# Patient Record
Sex: Female | Born: 1980 | Race: White | Hispanic: No | Marital: Married | Smoking: Never smoker
Health system: Southern US, Community
[De-identification: ages and names within clinical notes are randomized; demographics above are authoritative.]

## PROBLEM LIST (undated history)

## (undated) DIAGNOSIS — N159 Renal tubulo-interstitial disease, unspecified: Secondary | ICD-10-CM

## (undated) HISTORY — PX: KNEE SURGERY: SHX244

## (undated) HISTORY — DX: Renal tubulo-interstitial disease, unspecified: N15.9

---

## 2005-02-13 ENCOUNTER — Other Ambulatory Visit: Admission: RE | Admit: 2005-02-13 | Discharge: 2005-02-13 | Payer: Self-pay | Admitting: Family Medicine

## 2005-03-27 ENCOUNTER — Encounter: Admission: RE | Admit: 2005-03-27 | Discharge: 2005-03-27 | Payer: Self-pay | Admitting: Family Medicine

## 2005-12-10 ENCOUNTER — Ambulatory Visit: Payer: Self-pay | Admitting: Family Medicine

## 2006-08-01 ENCOUNTER — Inpatient Hospital Stay (HOSPITAL_COMMUNITY): Admission: AD | Admit: 2006-08-01 | Discharge: 2006-08-04 | Payer: Self-pay | Admitting: Obstetrics and Gynecology

## 2007-04-01 ENCOUNTER — Ambulatory Visit: Payer: Self-pay | Admitting: Family Medicine

## 2007-07-10 ENCOUNTER — Ambulatory Visit: Payer: Self-pay | Admitting: Family Medicine

## 2008-07-20 ENCOUNTER — Ambulatory Visit: Payer: Self-pay | Admitting: Family Medicine

## 2009-06-29 ENCOUNTER — Inpatient Hospital Stay (HOSPITAL_COMMUNITY): Admission: AD | Admit: 2009-06-29 | Discharge: 2009-06-29 | Payer: Self-pay | Admitting: Obstetrics & Gynecology

## 2009-11-15 ENCOUNTER — Inpatient Hospital Stay (HOSPITAL_COMMUNITY): Admission: RE | Admit: 2009-11-15 | Discharge: 2009-11-17 | Payer: Self-pay | Admitting: Obstetrics and Gynecology

## 2010-01-09 ENCOUNTER — Ambulatory Visit: Payer: Self-pay | Admitting: Physician Assistant

## 2010-01-12 ENCOUNTER — Ambulatory Visit: Payer: Self-pay | Admitting: Physician Assistant

## 2010-07-02 NOTE — L&D Delivery Note (Signed)
Delivery Note At 4:33 PM a viable female was delivered via Vaginal, Spontaneous Delivery (Presentation: ; Occiput Anterior).  APGAR: 9, 9; weight .   Placenta status: , .  Cord: 3 vessels with the following complications: .  Cord pH: not done  Anesthesia: Epidural  Episiotomy: none Lacerations: second dgree Suture Repair: chromic Est. Blood Loss (mL):   Mom to postpartum.  Baby to nursery-stable.  [name] S 03/14/2011, 4:42 PM

## 2010-08-09 LAB — GC/CHLAMYDIA PROBE AMP, GENITAL: Gonorrhea: NEGATIVE

## 2010-08-09 LAB — RPR: RPR: NONREACTIVE

## 2010-08-09 LAB — ABO/RH: RH Type: POSITIVE

## 2010-09-18 LAB — CBC
HCT: 35.1 % — ABNORMAL LOW (ref 36.0–46.0)
MCHC: 34.5 g/dL (ref 30.0–36.0)
MCV: 92.7 fL (ref 78.0–100.0)
MCV: 92.8 fL (ref 78.0–100.0)
Platelets: 198 10*3/uL (ref 150–400)
Platelets: 246 10*3/uL (ref 150–400)
RBC: 3.28 MIL/uL — ABNORMAL LOW (ref 3.87–5.11)
RBC: 3.78 MIL/uL — ABNORMAL LOW (ref 3.87–5.11)
RDW: 14.7 % (ref 11.5–15.5)

## 2010-09-18 LAB — RPR: RPR Ser Ql: NONREACTIVE

## 2010-10-02 LAB — CBC
MCHC: 33.6 g/dL (ref 30.0–36.0)
Platelets: 286 10*3/uL (ref 150–400)
RDW: 13.5 % (ref 11.5–15.5)

## 2010-10-02 LAB — COMPREHENSIVE METABOLIC PANEL
Alkaline Phosphatase: 47 U/L (ref 39–117)
Calcium: 9.1 mg/dL (ref 8.4–10.5)
GFR calc Af Amer: >60 mL/min (ref >60)
Total Protein: 5.7 g/dL — ABNORMAL LOW (ref 6.0–8.3)

## 2010-10-02 LAB — URINALYSIS, ROUTINE W REFLEX MICROSCOPIC
Bilirubin Urine: NEGATIVE
Glucose, UA: 250 mg/dL — AB
Hgb urine dipstick: NEGATIVE
Ketones, ur: NEGATIVE mg/dL

## 2010-10-02 LAB — URINE MICROSCOPIC-ADD ON

## 2011-03-01 ENCOUNTER — Other Ambulatory Visit: Payer: Self-pay

## 2011-03-01 ENCOUNTER — Telehealth: Payer: Self-pay | Admitting: Family Medicine

## 2011-03-01 MED ORDER — ALBUTEROL SULFATE HFA 108 (90 BASE) MCG/ACT IN AERS: 2.0000 | INHALATION_SPRAY | Freq: Four times a day (QID) | RESPIRATORY_TRACT | Status: DC | PRN

## 2011-03-01 NOTE — Telephone Encounter (Signed)
Sent pt meds in  

## 2011-03-01 NOTE — Telephone Encounter (Signed)
Med sent in again  

## 2011-03-06 ENCOUNTER — Telehealth: Payer: Self-pay | Admitting: Medical

## 2011-03-06 ENCOUNTER — Other Ambulatory Visit: Payer: Self-pay | Admitting: Medical

## 2011-03-06 MED ORDER — ALBUTEROL SULFATE HFA 108 (90 BASE) MCG/ACT IN AERS: 2.0000 | INHALATION_SPRAY | Freq: Four times a day (QID) | RESPIRATORY_TRACT | Status: DC | PRN

## 2011-03-06 NOTE — Telephone Encounter (Signed)
i sent refill on inhaler.  I don't think we've seen her in since 7/11.  Lets see her back for recheck on albuterol/asthma, or physical if due.

## 2011-03-07 ENCOUNTER — Other Ambulatory Visit: Payer: Self-pay | Admitting: *Deleted

## 2011-03-09 ENCOUNTER — Encounter (HOSPITAL_COMMUNITY): Payer: Self-pay | Admitting: *Deleted

## 2011-03-09 ENCOUNTER — Telehealth (HOSPITAL_COMMUNITY): Payer: Self-pay | Admitting: *Deleted

## 2011-03-09 NOTE — Telephone Encounter (Signed)
Preadmission screen  

## 2011-03-14 ENCOUNTER — Inpatient Hospital Stay (HOSPITAL_COMMUNITY)
Admission: RE | Admit: 2011-03-14 | Discharge: 2011-03-16 | DRG: 775 | Disposition: A | Discharge status: Home / Self Care | Payer: 59 | Source: Ambulatory Visit | Attending: Obstetrics and Gynecology | Admitting: Obstetrics and Gynecology

## 2011-03-14 ENCOUNTER — Inpatient Hospital Stay (HOSPITAL_COMMUNITY): Payer: 59 | Admitting: Anesthesiology

## 2011-03-14 ENCOUNTER — Encounter (HOSPITAL_COMMUNITY): Payer: Self-pay | Admitting: Anesthesiology

## 2011-03-14 ENCOUNTER — Encounter (HOSPITAL_COMMUNITY): Payer: Self-pay

## 2011-03-14 LAB — ABO/RH: ABO/RH(D): O POS

## 2011-03-14 LAB — CBC
HCT: 35.2 % — ABNORMAL LOW (ref 36.0–46.0)
MCHC: 33.8 g/dL (ref 30.0–36.0)
MCV: 92.4 fL (ref 78.0–100.0)
Platelets: 200 10*3/uL (ref 150–400)
RDW: 14 % (ref 11.5–15.5)
WBC: 13 10*3/uL — ABNORMAL HIGH (ref 4.0–10.5)

## 2011-03-14 MED ORDER — PRENATAL PLUS 27-1 MG PO TABS
1.0000 | ORAL_TABLET | Freq: Every day | ORAL | Status: DC
  Administered 2011-03-15 – 2011-03-16 (×2): 1 via ORAL
  Filled 2011-03-14 (×2): qty 1

## 2011-03-14 MED ORDER — PHENYLEPHRINE 40 MCG/ML (10ML) SYRINGE FOR IV PUSH (FOR BLOOD PRESSURE SUPPORT)
80.0000 ug | PREFILLED_SYRINGE | INTRAVENOUS | Status: DC | PRN
  Administered 2011-03-14: 80 ug via INTRAVENOUS
  Administered 2011-03-14: 10 ug via INTRAVENOUS
  Filled 2011-03-14: qty 5

## 2011-03-14 MED ORDER — SIMETHICONE 80 MG PO CHEW: 80.0000 mg | CHEWABLE_TABLET | ORAL | Status: DC | PRN

## 2011-03-14 MED ORDER — IBUPROFEN 600 MG PO TABS
600.0000 mg | ORAL_TABLET | Freq: Four times a day (QID) | ORAL | Status: DC
  Administered 2011-03-15 – 2011-03-16 (×6): 600 mg via ORAL
  Filled 2011-03-14 (×6): qty 1

## 2011-03-14 MED ORDER — OXYTOCIN 20 UNITS IN LACTATED RINGERS INFUSION - SIMPLE: 125.0000 mL/h | Freq: Once | INTRAVENOUS | Status: DC

## 2011-03-14 MED ORDER — LANOLIN HYDROUS EX OINT: TOPICAL_OINTMENT | CUTANEOUS | Status: DC | PRN

## 2011-03-14 MED ORDER — CITRIC ACID-SODIUM CITRATE 334-500 MG/5ML PO SOLN: 30.0000 mL | ORAL | Status: DC | PRN

## 2011-03-14 MED ORDER — FENTANYL 2.5 MCG/ML BUPIVACAINE 1/10 % EPIDURAL INFUSION (WH - ANES)
14.0000 mL/h | INTRAMUSCULAR | Status: DC
  Administered 2011-03-14: 14 mL/h via EPIDURAL
  Filled 2011-03-14: qty 60

## 2011-03-14 MED ORDER — DIPHENHYDRAMINE HCL 25 MG PO CAPS: 25.0000 mg | ORAL_CAPSULE | Freq: Four times a day (QID) | ORAL | Status: DC | PRN

## 2011-03-14 MED ORDER — BENZOCAINE-MENTHOL 20-0.5 % EX AERO
1.0000 "application " | INHALATION_SPRAY | CUTANEOUS | Status: DC | PRN
  Administered 2011-03-15: 1 via TOPICAL

## 2011-03-14 MED ORDER — LACTATED RINGERS IV SOLN: INTRAVENOUS | Status: DC

## 2011-03-14 MED ORDER — SENNOSIDES-DOCUSATE SODIUM 8.6-50 MG PO TABS
2.0000 | ORAL_TABLET | Freq: Every day | ORAL | Status: DC
  Administered 2011-03-15: 2 via ORAL

## 2011-03-14 MED ORDER — OXYCODONE-ACETAMINOPHEN 5-325 MG PO TABS: 2.0000 | ORAL_TABLET | ORAL | Status: DC | PRN

## 2011-03-14 MED ORDER — TETANUS-DIPHTH-ACELL PERTUSSIS 5-2.5-18.5 LF-MCG/0.5 IM SUSP: 0.5000 mL | Freq: Once | INTRAMUSCULAR | Status: DC

## 2011-03-14 MED ORDER — ONDANSETRON HCL 4 MG/2ML IJ SOLN
4.0000 mg | Freq: Four times a day (QID) | INTRAMUSCULAR | Status: DC | PRN
  Administered 2011-03-14: 4 mg via INTRAVENOUS
  Filled 2011-03-14: qty 2

## 2011-03-14 MED ORDER — INFLUENZA VIRUS VACC SPLIT PF IM SUSP
0.5000 mL | Freq: Once | INTRAMUSCULAR | Status: AC
  Administered 2011-03-15: 0.5 mL via INTRAMUSCULAR
  Filled 2011-03-14: qty 0.5

## 2011-03-14 MED ORDER — IBUPROFEN 600 MG PO TABS
600.0000 mg | ORAL_TABLET | Freq: Four times a day (QID) | ORAL | Status: DC | PRN
  Administered 2011-03-14: 600 mg via ORAL
  Filled 2011-03-14 (×2): qty 1

## 2011-03-14 MED ORDER — BISACODYL 10 MG RE SUPP: 10.0000 mg | Freq: Every day | RECTAL | Status: DC | PRN

## 2011-03-14 MED ORDER — OXYTOCIN BOLUS FROM INFUSION
500.0000 mL | Freq: Once | INTRAVENOUS | Status: DC
  Filled 2011-03-14: qty 500

## 2011-03-14 MED ORDER — ONDANSETRON HCL 4 MG PO TABS: 4.0000 mg | ORAL_TABLET | ORAL | Status: DC | PRN

## 2011-03-14 MED ORDER — ZOLPIDEM TARTRATE 5 MG PO TABS: 5.0000 mg | ORAL_TABLET | Freq: Every evening | ORAL | Status: DC | PRN

## 2011-03-14 MED ORDER — WITCH HAZEL-GLYCERIN EX PADS: 1.0000 "application " | MEDICATED_PAD | CUTANEOUS | Status: DC | PRN

## 2011-03-14 MED ORDER — EPHEDRINE 5 MG/ML INJ
10.0000 mg | INTRAVENOUS | Status: DC | PRN
  Filled 2011-03-14 (×2): qty 4

## 2011-03-14 MED ORDER — FLEET ENEMA 7-19 GM/118ML RE ENEM: 1.0000 | ENEMA | RECTAL | Status: DC | PRN

## 2011-03-14 MED ORDER — DIPHENHYDRAMINE HCL 50 MG/ML IJ SOLN: 12.5000 mg | INTRAMUSCULAR | Status: DC | PRN

## 2011-03-14 MED ORDER — ALBUTEROL SULFATE HFA 108 (90 BASE) MCG/ACT IN AERS
2.0000 | INHALATION_SPRAY | Freq: Four times a day (QID) | RESPIRATORY_TRACT | Status: DC | PRN
  Filled 2011-03-14: qty 6.7

## 2011-03-14 MED ORDER — EPHEDRINE 5 MG/ML INJ
10.0000 mg | INTRAVENOUS | Status: DC | PRN
  Filled 2011-03-14: qty 4

## 2011-03-14 MED ORDER — ACETAMINOPHEN 325 MG PO TABS: 650.0000 mg | ORAL_TABLET | ORAL | Status: DC | PRN

## 2011-03-14 MED ORDER — TERBUTALINE SULFATE 1 MG/ML IJ SOLN: 0.2500 mg | Freq: Once | INTRAMUSCULAR | Status: DC | PRN

## 2011-03-14 MED ORDER — LACTATED RINGERS IV SOLN
500.0000 mL | INTRAVENOUS | Status: DC | PRN
  Administered 2011-03-14: 300 mL via INTRAVENOUS
  Administered 2011-03-14: 1000 mL via INTRAVENOUS

## 2011-03-14 MED ORDER — LIDOCAINE HCL (PF) 1 % IJ SOLN
30.0000 mL | INTRAMUSCULAR | Status: DC | PRN
  Filled 2011-03-14 (×2): qty 30

## 2011-03-14 MED ORDER — DIBUCAINE 1 % RE OINT: 1.0000 "application " | TOPICAL_OINTMENT | RECTAL | Status: DC | PRN

## 2011-03-14 MED ORDER — OXYTOCIN 20 UNITS IN LACTATED RINGERS INFUSION - SIMPLE
1.0000 m[IU]/min | INTRAVENOUS | Status: DC
  Administered 2011-03-14: 8 m[IU]/min via INTRAVENOUS
  Administered 2011-03-14: 6 m[IU]/min via INTRAVENOUS
  Administered 2011-03-14: 4 m[IU]/min via INTRAVENOUS
  Administered 2011-03-14: 2 m[IU]/min via INTRAVENOUS
  Filled 2011-03-14: qty 1000

## 2011-03-14 MED ORDER — LACTATED RINGERS IV SOLN: 500.0000 mL | Freq: Once | INTRAVENOUS | Status: DC

## 2011-03-14 MED ORDER — ONDANSETRON HCL 4 MG/2ML IJ SOLN: 4.0000 mg | INTRAMUSCULAR | Status: DC | PRN

## 2011-03-14 MED ORDER — PHENYLEPHRINE 40 MCG/ML (10ML) SYRINGE FOR IV PUSH (FOR BLOOD PRESSURE SUPPORT)
80.0000 ug | PREFILLED_SYRINGE | INTRAVENOUS | Status: DC | PRN
  Filled 2011-03-14 (×2): qty 5

## 2011-03-14 MED ORDER — LIDOCAINE HCL 1.5 % IJ SOLN
INTRAMUSCULAR | Status: DC | PRN
  Administered 2011-03-14: 5 mL via EPIDURAL
  Administered 2011-03-14: 2 mL via EPIDURAL
  Administered 2011-03-14: 5 mL via EPIDURAL

## 2011-03-14 NOTE — Progress Notes (Deleted)
  On pitocin. C/o swelling and tightness in legs  R>L. Exam of legs pulses 2+ equal  Blanching erythema of both legs  2-3+ edema.  Not cords or tenderness in calf' s.  FHR reactive no decel. Ctx's 2-4 min on 6 mu of pitocin. cx still closed to FT.  Scarring of cx may be preventing dilation.

## 2011-03-14 NOTE — Anesthesia Procedure Notes (Signed)
Epidural Patient location during procedure: OB Start time: 03/14/2011 12:20 PM Reason for block: procedure for pain  Staffing Performed by: anesthesiologist   Preanesthetic Checklist Completed: patient identified, site marked, surgical consent, pre-op evaluation, timeout performed, IV checked, risks and benefits discussed and monitors and equipment checked  Epidural Patient position: sitting Prep: site prepped and draped and DuraPrep Patient monitoring: continuous pulse ox and blood pressure Approach: midline Injection technique: LOR air  Needle:  Needle type: Tuohy  Needle gauge: 17 G Needle length: 9 cm Catheter type: closed end flexible Catheter size: 19 Gauge Test dose: negative  Assessment Events: blood not aspirated, injection not painful, no injection resistance, negative IV test and no paresthesia

## 2011-03-14 NOTE — H&P (Signed)
Christina Velez is a 30 y.o. female presenting for arom.  At term with favorable cx. Neg GBS Maternal Medical History:  Prenatal complications: no prenatal complications Prenatal Complications - Diabetes: none.    OB History    Grav Para Term Preterm Abortions TAB SAB Ect Mult Living   3 2 2       2      Past Medical History  Diagnosis Date  . Kidney infection     hospitalized at 30y.o.  Marland Kitchen Asthma    Past Surgical History  Procedure Date  . Knee surgery     L knee 30 years old   Family History: family history includes Diabetes in her mother and Hypertension in her mother. Social History:  reports that she has never smoked. She has never used smokeless tobacco. She reports that she does not drink alcohol or use illicit drugs.  Review of Systems  All other systems reviewed and are negative.      Blood pressure 112/69, pulse 77, temperature 97.3 F (36.3 C), temperature source Oral, resp. rate 18, height 5\' 6"  (1.676 m), weight 140 lb (63.504 kg), last menstrual period 06/11/2010. Maternal Exam:  Uterine Assessment: Contraction strength is mild.  Contraction frequency is rare.   Abdomen: Patient reports no abdominal tenderness. Fundal height is c/w dates.   Estimated fetal weight is 7 lbs.   Fetal presentation: vertex  Introitus: Amniotic fluid character: clear.  Pelvis: adequate for delivery.   Cervix: Cervix evaluated by digital exam.     Physical Exam  Prenatal labs: ABO, Rh: O/Positive/-- (02/08 0000) Antibody: Negative (02/08 0000) Rubella:   RPR: Nonreactive (02/08 0000)  HBsAg: Negative (02/08 0000)  HIV: Non-reactive (02/08 0000)  GBS: Negative (08/23 0000)   Assessment/Plan:IUP at 39 + weeks with favorable cx.  For AROM.  RISK OF PITOCIN DISCUSSED.   Marquice Uddin S 03/14/2011, 7:49 AM

## 2011-03-14 NOTE — Plan of Care (Signed)
Problem: Consults Goal: Birthing Suites Patient Information Press F2 to bring up selections list Outcome: Completed/Met Date Met:  03/14/11  Pt 37-[redacted] weeks EGA and Inpatient induction

## 2011-03-14 NOTE — Anesthesia Preprocedure Evaluation (Signed)
Anesthesia Evaluation  Name, MR# and DOB Patient awake  General Assessment Comment  Reviewed: Allergy & Precautions, H&P , NPO status , Patient's Chart, lab work & pertinent test results, reviewed documented beta blocker date and time   History of Anesthesia Complications (+) PONV  Airway Mallampati: I TM Distance: >3 FB Neck ROM: full    Dental  (+) Teeth Intact   Pulmonary  asthma (about once a week inhaler use)  clear to auscultation  breath sounds clear to auscultation none    Cardiovascular regular Normal    Neuro/Psych Negative Neurological ROS  Negative Psych ROS  GI/Hepatic/Renal negative GI ROS  negative Liver ROS  negative Renal ROS        Endo/Other  Negative Endocrine ROS (+)      Abdominal   Musculoskeletal   Hematology negative hematology ROS (+)   Peds  Reproductive/Obstetrics (+) Pregnancy    Anesthesia Other Findings             Anesthesia Physical Anesthesia Plan  ASA: II  Anesthesia Plan: Epidural   Post-op Pain Management:    Induction:   Airway Management Planned:   Additional Equipment:   Intra-op Plan:   Post-operative Plan:   Informed Consent: I have reviewed the patients History and Physical, chart, labs and discussed the procedure including the risks, benefits and alternatives for the proposed anesthesia with the patient or authorized representative who has indicated his/her understanding and acceptance.     Plan Discussed with:   Anesthesia Plan Comments:         Anesthesia Quick Evaluation

## 2011-03-15 LAB — CBC
HCT: 31.2 % — ABNORMAL LOW (ref 36.0–46.0)
Hemoglobin: 10.3 g/dL — ABNORMAL LOW (ref 12.0–15.0)
MCHC: 33 g/dL (ref 30.0–36.0)
RBC: 3.33 MIL/uL — ABNORMAL LOW (ref 3.87–5.11)
WBC: 14.6 10*3/uL — ABNORMAL HIGH (ref 4.0–10.5)

## 2011-03-15 MED ORDER — BENZOCAINE-MENTHOL 20-0.5 % EX AERO
INHALATION_SPRAY | CUTANEOUS | Status: AC
  Administered 2011-03-15: 1 via TOPICAL
  Filled 2011-03-15: qty 56

## 2011-03-15 NOTE — Progress Notes (Signed)
Post Partum Day 1 Subjective: no complaints and tolerating PO  Objective: Blood pressure 103/66, pulse 62, temperature 98.1 F (36.7 C), temperature source Oral, resp. rate 16, height 5\' 6"  (1.676 m), weight 63.504 kg (140 lb), last menstrual period 06/11/2010, SpO2 97.00%, unknown if currently breastfeeding.  Physical Exam:  General: alert and cooperative Lochia: appropriate Uterine Fundus: firm DVT Evaluation: No evidence of DVT seen on physical exam.   Basename 03/15/11 0605 03/14/11 0709  HGB 10.3* 11.9*  HCT 31.2* 35.2*    Assessment/Plan: Plan for discharge tomorrow and Breastfeeding   LOS: 1 day   Christina Velez 03/15/2011, 9:08 AM

## 2011-03-15 NOTE — Anesthesia Postprocedure Evaluation (Signed)
  Anesthesia Post-op Note  Patient: Christina Velez  Procedure(s) Performed: * No procedures listed *  Patient Location: PACU and Mother/Baby  Anesthesia Type: Epidural  Level of Consciousness: awake, alert  and oriented  Airway and Oxygen Therapy: Patient Spontanous Breathing  Post-op Pain: mild  Post-op Assessment: Patient's Cardiovascular Status Stable and Respiratory Function Stable  Post-op Vital Signs: stable  Complications: No apparent anesthesia complications

## 2011-03-16 NOTE — Progress Notes (Signed)
UR Chart review completed.  

## 2011-03-16 NOTE — Progress Notes (Signed)
Patient is doing very well  AV VSS Uterus is non tender Lochia is normal  IMP Post Partum Day #2 Doing very well  PLAN Discharge home No meds Discharge instructions FOllowup in 6 weeks

## 2011-03-16 NOTE — Discharge Summary (Signed)
Obstetric Discharge Summary Reason for Admission: induction of labor Prenatal Procedures: none Intrapartum Procedures: spontaneous vaginal delivery Postpartum Procedures: none Complications-Operative and Postpartum: none and 1 degree perineal laceration Hemoglobin  Date Value Range Status  03/15/2011 10.3* 12.0-15.0 (g/dL) Final     HCT  Date Value Range Status  03/15/2011 31.2* 36.0-46.0 (%) Final    Discharge Diagnoses: Term Pregnancy-delivered  Discharge Information: Date: 03/16/2011 Activity: pelvic rest Diet: routine Medications: PNV Condition: stable Instructions: refer to practice specific booklet Discharge to: home   Newborn Data: Live born female  Birth Weight: 7 lb 4.6 oz (3306 g) APGAR: 9, 9  Home with mother.  Christina Velez L 03/16/2011, 8:58 AM

## 2011-03-18 ENCOUNTER — Inpatient Hospital Stay (HOSPITAL_COMMUNITY): Admission: AD | Admit: 2011-03-18 | Payer: Self-pay | Source: Ambulatory Visit | Admitting: Obstetrics and Gynecology

## 2014-05-03 ENCOUNTER — Encounter (HOSPITAL_COMMUNITY): Payer: Self-pay

## 2016-03-01 ENCOUNTER — Encounter (HOSPITAL_BASED_OUTPATIENT_CLINIC_OR_DEPARTMENT_OTHER): Payer: Self-pay | Admitting: *Deleted

## 2016-03-01 ENCOUNTER — Emergency Department (HOSPITAL_BASED_OUTPATIENT_CLINIC_OR_DEPARTMENT_OTHER): Payer: No Typology Code available for payment source

## 2016-03-01 ENCOUNTER — Emergency Department (HOSPITAL_BASED_OUTPATIENT_CLINIC_OR_DEPARTMENT_OTHER)
Admission: EM | Admit: 2016-03-01 | Discharge: 2016-03-01 | Disposition: A | Discharge status: Home / Self Care | Payer: No Typology Code available for payment source | Attending: Emergency Medicine | Admitting: Emergency Medicine

## 2016-03-01 DIAGNOSIS — Y999 Unspecified external cause status: Secondary | ICD-10-CM | POA: Insufficient documentation

## 2016-03-01 DIAGNOSIS — R51 Headache: Secondary | ICD-10-CM | POA: Insufficient documentation

## 2016-03-01 DIAGNOSIS — J45909 Unspecified asthma, uncomplicated: Secondary | ICD-10-CM | POA: Diagnosis not present

## 2016-03-01 DIAGNOSIS — S161XXA Strain of muscle, fascia and tendon at neck level, initial encounter: Secondary | ICD-10-CM | POA: Diagnosis not present

## 2016-03-01 DIAGNOSIS — Y92410 Unspecified street and highway as the place of occurrence of the external cause: Secondary | ICD-10-CM | POA: Insufficient documentation

## 2016-03-01 DIAGNOSIS — S199XXA Unspecified injury of neck, initial encounter: Secondary | ICD-10-CM | POA: Diagnosis present

## 2016-03-01 DIAGNOSIS — Y9389 Activity, other specified: Secondary | ICD-10-CM | POA: Diagnosis not present

## 2016-03-01 MED ORDER — LIDOCAINE 5 % EX PTCH: 1.0000 | MEDICATED_PATCH | CUTANEOUS | 0 refills | Status: AC

## 2016-03-01 MED ORDER — HYDROCODONE-ACETAMINOPHEN 5-325 MG PO TABS: 1.0000 | ORAL_TABLET | Freq: Four times a day (QID) | ORAL | 0 refills | Status: AC | PRN

## 2016-03-01 MED ORDER — HYDROCODONE-ACETAMINOPHEN 5-325 MG PO TABS
1.0000 | ORAL_TABLET | Freq: Once | ORAL | Status: AC
  Administered 2016-03-01: 1 via ORAL
  Filled 2016-03-01: qty 1

## 2016-03-01 MED ORDER — NAPROXEN 500 MG PO TABS: 500.0000 mg | ORAL_TABLET | Freq: Two times a day (BID) | ORAL | 0 refills | Status: AC

## 2016-03-01 MED ORDER — METHOCARBAMOL 500 MG PO TABS
500.0000 mg | ORAL_TABLET | Freq: Once | ORAL | Status: AC
  Administered 2016-03-01: 500 mg via ORAL
  Filled 2016-03-01: qty 1

## 2016-03-01 MED ORDER — METHOCARBAMOL 500 MG PO TABS: 500.0000 mg | ORAL_TABLET | Freq: Two times a day (BID) | ORAL | 0 refills | Status: AC

## 2016-03-01 NOTE — Discharge Instructions (Signed)
Expect your soreness to increase over the next 2-3 days. Take it easy, but do not lay around too much as this may make the stiffness worse. Take 500 mg of naproxen every 12 hours or 800 mg of ibuprofen every 8 hours for the next 3 days. Take these medications with food to avoid upset stomach. Robaxin is a muscle relaxer and may help loosen stiff muscles. Vicodin for severe pain. Do not take the Robaxin or Vicodin while driving or performing other dangerous activities.
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
## 2016-03-01 NOTE — ED Notes (Signed)
c-collar applied in triage
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
## 2021-07-21 ENCOUNTER — Ambulatory Visit: Payer: 59 | Admitting: Podiatry

## 2021-07-21 ENCOUNTER — Ambulatory Visit (INDEPENDENT_AMBULATORY_CARE_PROVIDER_SITE_OTHER): Payer: 59

## 2021-07-21 ENCOUNTER — Other Ambulatory Visit: Payer: Self-pay

## 2021-07-21 DIAGNOSIS — M79672 Pain in left foot: Secondary | ICD-10-CM

## 2021-07-21 DIAGNOSIS — M21611 Bunion of right foot: Secondary | ICD-10-CM | POA: Diagnosis not present

## 2021-07-21 DIAGNOSIS — M21612 Bunion of left foot: Secondary | ICD-10-CM | POA: Diagnosis not present

## 2021-07-21 DIAGNOSIS — M79671 Pain in right foot: Secondary | ICD-10-CM

## 2021-07-21 NOTE — Progress Notes (Signed)
Subjective:   Patient ID: Christina Velez, female   DOB: 41 y.o.   MRN: [id number]   HPI 41 year old female presents the office today for concerns of painful bunions with her left foot worse than the right.  Patient is under the last couple years and has been getting worse.  She has tried cushions, inserts, different creams, socks and nothing seems to be helping.  She will consider other treatment options this point.  No recent injury.  She states that at max her pain level is 10/10.  No other concerns today.   Review of Systems  All other systems reviewed and are negative.  Past Medical History:  Diagnosis Date   Asthma    Kidney infection    hospitalized at 41y.o.    Past Surgical History:  Procedure Laterality Date   KNEE SURGERY     L knee 41 years old     Current Outpatient Medications:    albuterol (PROVENTIL HFA;VENTOLIN HFA) 108 (90 BASE) MCG/ACT inhaler, Inhale 2 puffs into the lungs every 6 (six) hours as needed. asthma , Disp: , Rfl:    HYDROcodone-acetaminophen (NORCO/VICODIN) 5-325 MG tablet, Take 1 tablet by mouth every 6 (six) hours as needed., Disp: 15 tablet, Rfl: 0   lidocaine (LIDODERM) 5 %, Place 1 patch onto the skin daily. Remove & Discard patch within 12 hours or as directed by MD, Disp: 30 patch, Rfl: 0   loratadine (CLARITIN) 10 MG tablet, Take 10 mg by mouth at bedtime.  , Disp: , Rfl:    methocarbamol (ROBAXIN) 500 MG tablet, Take 1 tablet (500 mg total) by mouth 2 (two) times daily., Disp: 20 tablet, Rfl: 0   naproxen (NAPROSYN) 500 MG tablet, Take 1 tablet (500 mg total) by mouth 2 (two) times daily., Disp: 30 tablet, Rfl: 0   prenatal vitamin w/FE, FA (PRENATAL 1 + 1) 27-1 MG TABS, Take 1 tablet by mouth daily.  , Disp: , Rfl:   Allergies  Allergen Reactions   Codeine Other (See Comments)    hallucinations   Penicillins Hives          Objective:  Physical Exam  General: AAO x3, NAD  Dermatological: Skin is warm, dry and supple bilateral.   There are no open sores, no preulcerative lesions, no rash or signs of infection present.  Vascular: Dorsalis Pedis artery and Posterior Tibial artery pedal pulses are 2/4 bilateral with immedate capillary fill time. There is no pain with calf compression, swelling, warmth, erythema.   Neruologic: Grossly intact via light touch bilateral.   Musculoskeletal: Moderate bunions are present bilaterally left side worse than right.  Tenderness directly on the medial first metatarsal head.  There is no pain or crepitation with first MPJ range of motion.  No hypermobility of the first ray.  Muscular strength 5/5 in all groups tested bilateral.  Gait: Unassisted, Nonantalgic.       Assessment:   41 year old female with symptomatic bunions left side worse than right     Plan:  -Treatment options discussed including all alternatives, risks, and complications -Etiology of symptoms were discussed -X-rays were obtained and reviewed with the patient.  Moderate bunions are present bilaterally with moderate increase in intermetatarsal angle.  No evidence of acute fracture. -We discussed both conservative as well as surgical treatment options.  The patient has attempted numerous conservative treatment without any significant resolution.  We discussed surgical intervention given first metatarsal osteotomy.  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  Discussed the surgery as well as postoperative course.  For now continue conservative care.  Trula Slade DPM

## 2021-07-21 NOTE — Patient Instructions (Signed)
You can use Voltaren gel on the bunion as needed. Continue the offloading pads  Bunion A bunion (hallux valgus) is a bump that forms slowly on the inner side of the big toe joint. It occurs when the big toe turns toward the second toe. Bunions may be small at first, but they often get larger over time. They can make walking painful. What are the causes? This condition may be caused by: Wearing narrow or pointed shoes that force the big toe to press against the other toes. Abnormal foot development that causes the foot to roll inward. Changes in the foot that are caused by certain diseases, such as rheumatoid arthritis or polio. A foot injury. What increases the risk? The following factors may make you more likely to develop this condition: Wearing shoes that squeeze the toes together. Having certain diseases, such as: Rheumatoid arthritis. Polio. Cerebral palsy. Having family members who have bunions. Being born with abnormally shaped feet (a foot deformity), such as flat feet or low arches. Doing activities that put a lot of pressure on the feet, such as ballet dancing. What are the signs or symptoms? The main symptom of this condition is a bump on your big toe that you can notice. Other symptoms may include: Pain. Redness and inflammation around your big toe. Thick or hardened skin on your big toe or between your toes. Stiffness or loss of motion in your big toe. Trouble with walking. How is this diagnosed? This condition may be diagnosed based on your symptoms, medical history, and activities. You may also have tests and imaging, such as: X-rays. These allow your health care provider to check the position of the bones in your foot and look for damage to your joint. They also help your health care provider determine the severity of your bunion and the best way to treat it. Joint aspiration. In this test, a sample of fluid is removed from the toe joint. This test may be done if you  are in a lot of pain. It helps rule out diseases that cause painful swelling of the joints, such as arthritis or gout. How is this treated? Treatment depends on the severity of your symptoms. The goal of treatment is to relieve symptoms and prevent your bunion from getting worse. Your health care provider may recommend: Wearing shoes that have a wide toe box, or using bunion pads to cushion the affected area. Taping your toes together to keep them in a normal position. Placing a device inside your shoe (orthotic device) to help reduce pressure on your toe joint. Taking medicine to ease pain and inflammation. Putting ice or heat on the affected area. Doing stretching exercises. Surgery, for severe cases. Follow these instructions at home: Managing pain, stiffness, and swelling   If directed, put ice on the painful area. To do this: Put ice in a plastic bag. Place a towel between your skin and the bag. Leave the ice on for 20 minutes, 2-3 times a day. Remove the ice if your skin turns bright red. This is very important. If you cannot feel pain, heat, or cold, you have a greater risk of damage to the area. If directed, apply heat to the affected area before you exercise. Use the heat source that your health care provider recommends, such as a moist heat pack or a heating pad. Place a towel between your skin and the heat source. Leave the heat on for 20-30 minutes. Remove the heat if your skin turns bright red.  This is especially important if you are unable to feel pain, heat, or cold. You have a greater risk of getting burned. General instructions Do exercises as told by your health care provider. Support your toe joint with proper footwear, shoe padding, or taping as told by your health care provider. Take over-the-counter and prescription medicines only as told by your health care provider. Do not use any products that contain nicotine or tobacco, such as cigarettes, e-cigarettes, and  chewing tobacco. If you need help quitting, ask your health care provider. Keep all follow-up visits. This is important. Contact a health care provider if: Your symptoms get worse. Your symptoms do not improve in 2 weeks. Get help right away if: You have severe pain and trouble with walking. Summary A bunion is a bump on the inner side of the big toe joint that forms when the big toe turns toward the second toe. Bunions can make walking painful. Treatment depends on the severity of your symptoms. Support your toe joint with proper footwear, shoe padding, or taping as told by your health care provider. This information is not intended to replace advice given to you by your health care provider. Make sure you discuss any questions you have with your health care provider. Document Revised: 10/23/2019 Document Reviewed: 10/23/2019 Elsevier Patient Education  2022 [employer].

## 2022-06-20 ENCOUNTER — Other Ambulatory Visit: Payer: Self-pay | Admitting: Orthopedic Surgery

## 2022-06-20 DIAGNOSIS — M25562 Pain in left knee: Secondary | ICD-10-CM

## 2022-07-14 ENCOUNTER — Ambulatory Visit
Admission: RE | Admit: 2022-07-14 | Discharge: 2022-07-14 | Disposition: A | Discharge status: Home / Self Care | Payer: Self-pay | Source: Ambulatory Visit | Attending: Orthopedic Surgery | Admitting: Orthopedic Surgery

## 2022-07-14 DIAGNOSIS — M25562 Pain in left knee: Secondary | ICD-10-CM

## 2022-10-02 ENCOUNTER — Other Ambulatory Visit: Payer: Self-pay | Admitting: Orthopedic Surgery

## 2022-10-02 DIAGNOSIS — M25562 Pain in left knee: Secondary | ICD-10-CM

## 2022-10-02 DIAGNOSIS — M25369 Other instability, unspecified knee: Secondary | ICD-10-CM
